# Patient Record
Sex: Female | Born: 2000 | Race: White | Hispanic: No | Marital: Single | Smoking: Never smoker
Health system: Southern US, Community
[De-identification: ages and names within clinical notes are randomized; demographics above are authoritative.]

## PROBLEM LIST (undated history)

## (undated) DIAGNOSIS — M419 Scoliosis, unspecified: Secondary | ICD-10-CM

## (undated) DIAGNOSIS — N83209 Unspecified ovarian cyst, unspecified side: Secondary | ICD-10-CM

## (undated) DIAGNOSIS — G43909 Migraine, unspecified, not intractable, without status migrainosus: Secondary | ICD-10-CM

## (undated) HISTORY — DX: Migraine, unspecified, not intractable, without status migrainosus: G43.909

## (undated) HISTORY — DX: Unspecified ovarian cyst, unspecified side: N83.209

---
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
## 2019-01-05 ENCOUNTER — Encounter: Payer: Self-pay | Admitting: Certified Nurse Midwife

## 2019-01-05 ENCOUNTER — Ambulatory Visit (INDEPENDENT_AMBULATORY_CARE_PROVIDER_SITE_OTHER): Payer: 59 | Admitting: Certified Nurse Midwife

## 2019-01-05 ENCOUNTER — Other Ambulatory Visit: Payer: Self-pay

## 2019-01-05 VITALS — BP 101/67 | HR 87 | Ht 67.0 in | Wt 116.0 lb

## 2019-01-05 DIAGNOSIS — G43109 Migraine with aura, not intractable, without status migrainosus: Secondary | ICD-10-CM

## 2019-01-05 DIAGNOSIS — Z975 Presence of (intrauterine) contraceptive device: Secondary | ICD-10-CM | POA: Diagnosis not present

## 2019-01-05 DIAGNOSIS — R102 Pelvic and perineal pain: Secondary | ICD-10-CM

## 2019-01-05 DIAGNOSIS — G43909 Migraine, unspecified, not intractable, without status migrainosus: Secondary | ICD-10-CM | POA: Insufficient documentation

## 2019-01-05 DIAGNOSIS — Z8742 Personal history of other diseases of the female genital tract: Secondary | ICD-10-CM | POA: Diagnosis not present

## 2019-01-05 NOTE — Progress Notes (Signed)
GYN ENCOUNTER NOTE  Subjective:       Christy Sanders is a 18 y.o. G0P0000 female is here for gynecologic evaluation of the following issues:  1. Intermittent pelvic pain for the last two (2) months-relief with Motrin and heating pad; not taking tramadol  Diagnosed with "right ovarian cyst" in ER on 12/01/2018.   Denies difficulty breathing or respiratory distress, chest pain, dysuria, dyspareunia, abnormal vaginal discharge, and leg pain or swelling.    Gynecologic History  Patient's last menstrual period was 12/05/2018 (exact date). Period Cycle (Days): 28 Period Duration (Days): 7 Period Pattern: Regular Menstrual Flow: Light Menstrual Control: Tampon Dysmenorrhea: (!) Moderate Dysmenorrhea Symptoms: Cramping  Contraception: IUD, Kyleena  Last Pap: N/A  Obstetric History  OB History  Gravida Para Term Preterm AB Living  0 0 0 0 0 0  SAB TAB Ectopic Multiple Live Births  0 0 0 0 0    Past Medical History:  Diagnosis Date  . Scoliosis     No past surgical history on file.  Current Outpatient Medications on File Prior to Visit  Medication Sig Dispense Refill  . ibuprofen (ADVIL) 400 MG tablet Take 400 mg by mouth every 6 (six) hours as needed.     No current facility-administered medications on file prior to visit.     No Known Allergies  Social History   Socioeconomic History  . Marital status: Single    Spouse name: Not on file  . Number of children: Not on file  . Years of education: Not on file  . Highest education level: Not on file  Occupational History  . Not on file  Social Needs  . Financial resource strain: Not on file  . Food insecurity    Worry: Not on file    Inability: Not on file  . Transportation needs    Medical: Not on file    Non-medical: Not on file  Tobacco Use  . Smoking status: Never Smoker  . Smokeless tobacco: Never Used  Substance and Sexual Activity  . Alcohol use: Not Currently  . Drug use: Never  . Sexual activity:  Yes    Birth control/protection: I.U.D., Condom  Lifestyle  . Physical activity    Days per week: Not on file    Minutes per session: Not on file  . Stress: Not on file  Relationships  . Social Musicianconnections    Talks on phone: Not on file    Gets together: Not on file    Attends religious service: Not on file    Active member of club or organization: Not on file    Attends meetings of clubs or organizations: Not on file    Relationship status: Not on file  . Intimate partner violence    Fear of current or ex partner: Not on file    Emotionally abused: Not on file    Physically abused: Not on file    Forced sexual activity: Not on file  Other Topics Concern  . Not on file  Social History Narrative  . Not on file    Family History  Problem Relation Age of Onset  . Ovarian cancer Maternal Grandmother   . Breast cancer Neg Hx   . Colon cancer Neg Hx     The following portions of the patient's history were reviewed and updated as appropriate: allergies, current medications, past family history, past medical history, past social history, past surgical history and problem list.  Review of Systems  ROS negative except  as noted above. Information obtained from patient.   Objective:   BP 101/67   Pulse 87   Ht 5\' 7"  (1.702 m)   Wt 116 lb (52.6 kg)   LMP 12/05/2018 (Exact Date)   BMI 18.17 kg/m    CONSTITUTIONAL: Well-developed, well-nourished female in no acute distress.   ABDOMEN: Soft, non distended; Right lower quadrant tenderness with palapation.  No Organomegaly.  MUSCULOSKELETAL: Normal range of motion. No tenderness.  No cyanosis, clubbing, or edema.  Recent Results (from the past 2160 hour(s))  Lipase, blood     Status: None   Collection Time: 11/30/18  3:06 PM  Result Value Ref Range   Lipase 38 11 - 51 U/L    Comment: Performed at [facility], [address] [place], [place]  Comprehensive metabolic panel     Status: Abnormal    Collection Time: 11/30/18  3:06 PM  Result Value Ref Range   Sodium 139 135 - 145 mmol/L   Potassium 3.7 3.5 - 5.1 mmol/L   Chloride 105 98 - 111 mmol/L   CO2 23 22 - 32 mmol/L   Glucose, Bld 115 (H) 70 - 99 mg/dL   BUN 19 6 - 20 mg/dL   Creatinine, Ser 01/30/19 0.44 - 1.00 mg/dL   Calcium 9.9 8.9 - 2.44 mg/dL   Total Protein 7.5 6.5 - 8.1 g/dL   Albumin 4.3 3.5 - 5.0 g/dL   AST 17 15 - 41 U/L   ALT 11 0 - 44 U/L   Alkaline Phosphatase 57 38 - 126 U/L   Total Bilirubin 0.5 0.3 - 1.2 mg/dL   GFR calc non Af Amer >60 >60 mL/min   GFR calc Af Amer >60 >60 mL/min   Anion gap 11 5 - 15    Comment: Performed at [facility], [address] [place], [place]  CBC     Status: None   Collection Time: 11/30/18  3:06 PM  Result Value Ref Range   WBC 5.7 4.0 - 10.5 K/uL   RBC 4.71 3.87 - 5.11 MIL/uL   Hemoglobin 13.8 12.0 - 15.0 g/dL   HCT 01/30/19 66.4 - 40.3 %   MCV 87.0 80.0 - 100.0 fL   MCH 29.3 26.0 - 34.0 pg   MCHC 33.7 30.0 - 36.0 g/dL   RDW 47.4 25.9 - 56.3 %   Platelets 216 150 - 400 K/uL   nRBC 0.0 0.0 - 0.2 %    Comment: Performed at [facility], [address] [place], [place]  Urinalysis, Complete w Microscopic     Status: Abnormal   Collection Time: 11/30/18  3:06 PM  Result Value Ref Range   Color, Urine STRAW (A) YELLOW   APPearance CLEAR (A) CLEAR   Specific Gravity, Urine 1.014 1.005 - 1.030   pH 5.0 5.0 - 8.0   Glucose, UA NEGATIVE NEGATIVE mg/dL   Hgb urine dipstick NEGATIVE NEGATIVE   Bilirubin Urine NEGATIVE NEGATIVE   Ketones, ur NEGATIVE NEGATIVE mg/dL   Protein, ur NEGATIVE NEGATIVE mg/dL   Nitrite NEGATIVE NEGATIVE   Leukocytes,Ua NEGATIVE NEGATIVE   RBC / HPF 0-5 0 - 5 RBC/hpf   WBC, UA 0-5 0 - 5 WBC/hpf   Bacteria, UA RARE (A) NONE SEEN   Squamous Epithelial / LPF 0-5 0 - 5   Mucus PRESENT     Comment: Performed at [facility], [address], [place], [place]  Pregnancy, urine POC  Status:  None   Collection Time: 11/30/18  3:14 PM  Result Value Ref Range   Preg Test, Ur NEGATIVE NEGATIVE    Comment:        THE SENSITIVITY OF THIS METHODOLOGY IS >24 mIU/mL   Chlamydia/NGC rt PCR ([facility] only)     Status: None   Collection Time: 12/01/18 11:07 PM   Specimen: Urine  Result Value Ref Range   Specimen source GC/Chlam URINE, RANDOM    Chlamydia Tr NOT DETECTED NOT DETECTED   N gonorrhoeae NOT DETECTED NOT DETECTED    Comment: (NOTE) This CT/NG assay has not been evaluated in patients with a history of  hysterectomy. Performed at [facility], [address] [place],  [address]     CLINICAL DATA:  Right lower quadrant pain.  Appendicitis suspected.  EXAM: CT ABDOMEN AND PELVIS WITH CONTRAST [facility] ER-12/01/2018 1800)  TECHNIQUE: Multidetector CT imaging of the abdomen and pelvis was performed using the standard protocol following bolus administration of intravenous contrast.  CONTRAST:  42mL OMNIPAQUE IOHEXOL 300 MG/ML  SOLN  COMPARISON:  Pelvic ultrasound of 1 day prior.  FINDINGS: Lower chest: Heart size accentuated by a pectus excavatum deformity. Clear lung bases. No pleural fluid.  Hepatobiliary: Normal liver. Normal gallbladder, without biliary ductal dilatation.  Pancreas: Normal, without mass or ductal dilatation.  Spleen: Normal in size, without focal abnormality.  Adrenals/Urinary Tract: Normal adrenal glands. Normal kidneys, without hydronephrosis. Normal urinary bladder.  Stomach/Bowel: Normal stomach, without wall thickening. Normal colon and terminal ileum. Portions of the appendix are felt to be identified, including on coronal image 24/5, possibly sagittal image 41. Normal small bowel. No pericecal inflammation is seen  Vascular/Lymphatic: Normal caliber of the aorta and branch vessels. No abdominopelvic adenopathy.  Reproductive: Intrauterine device. Right ovarian corpus luteal cyst, including at 1.7 cm on  64/2.  Other: Trace right pelvic fluid is likely physiologic and may relate to recent cyst rupture.  Musculoskeletal: Moderate convex left lumbar spine curvature.  IMPRESSION: 1. Right ovarian corpus luteal cyst. This likely accounts for the patient's right lower quadrant pain. Fluid in the pelvis may be physiologic or related to recent cyst rupture. 2. The appendix is difficult to visualize, but felt to be normal where visualized. No pericecal inflammation seen.   Assessment:   1. History of ovarian cyst  - US PELVIS TRANSVAGINAL NON-OB (TV ONLY); Future  2. Pelvic pain  - US PELVIS TRANSVAGINAL NON-OB (TV ONLY); Future  3. IUD (intrauterine device) in place  - US PELVIS TRANSVAGINAL NON-OB (TV ONLY); Future   Plan:   Education provided regarding ovarian cysts and management options, see AVS.   Sample of [place] given.   Reviewed red flag symptoms and when to call.   RTC x 2-3 weeks for ultrasound and results review or sooner if needed.    Diona Fanti, CNM [facility], [facility] 01/05/19 10:25 AM

## 2019-01-05 NOTE — Progress Notes (Signed)
Patient c/o intermittent pelvic pain x2 months, went to ER 9/3 and was dx with right ovarian cyst.  Discontinued Tramadol because "it didn't work", taking Advil and using heating pad with relief.

## 2019-01-05 NOTE — Patient Instructions (Addendum)
Ovarian Cyst     An ovarian cyst is a fluid-filled sac that forms on an ovary. The ovaries are small organs that produce eggs in women. Various types of cysts can form on the ovaries. Some may cause symptoms and require treatment. Most ovarian cysts go away on their own, are not cancerous (are benign), and do not cause problems. Common types of ovarian cysts include:  Functional (follicle) cysts. ? Occur during the menstrual cycle, and usually go away with the next menstrual cycle if you do not get pregnant. ? Usually cause no symptoms.  Endometriomas. ? Are cysts that form from the tissue that lines the uterus (endometrium). ? Are sometimes called "chocolate cysts" because they become filled with blood that turns brown. ? Can cause pain in the lower abdomen during intercourse and during your period.  Cystadenoma cysts. ? Develop from cells on the outside surface of the ovary. ? Can get very large and cause lower abdomen pain and pain with intercourse. ? Can cause severe pain if they twist or break open (rupture).  Dermoid cysts. ? Are sometimes found in both ovaries. ? May contain different kinds of body tissue, such as skin, teeth, hair, or cartilage. ? Usually do not cause symptoms unless they get very big.  Theca lutein cysts. ? Occur when too much of a certain hormone (human chorionic gonadotropin) is produced and overstimulates the ovaries to produce an egg. ? Are most common after having procedures used to assist with the conception of a baby (in vitro fertilization). What are the causes? Ovarian cysts may be caused by:  Ovarian hyperstimulation syndrome. This is a condition that can develop from taking fertility medicines. It causes multiple large ovarian cysts to form.  Polycystic ovarian syndrome (PCOS). This is a common hormonal disorder that can cause ovarian cysts, as well as problems with your period or fertility. What increases the risk? The following factors may  make you more likely to develop ovarian cysts:  Being overweight or obese.  Taking fertility medicines.  Taking certain forms of hormonal birth control.  Smoking. What are the signs or symptoms? Many ovarian cysts do not cause symptoms. If symptoms are present, they may include:  Pelvic pain or pressure.  Pain in the lower abdomen.  Pain during sex.  Abdominal swelling.  Abnormal menstrual periods.  Increasing pain with menstrual periods. How is this diagnosed? These cysts are commonly found during a routine pelvic exam. You may have tests to find out more about the cyst, such as:  Ultrasound.  X-ray of the pelvis.  CT scan.  MRI.  Blood tests. How is this treated? Many ovarian cysts go away on their own without treatment. Your health care provider may want to check your cyst regularly for 2-3 months to see if it changes. If you are in menopause, it is especially important to have your cyst monitored closely because menopausal women have a higher rate of ovarian cancer. When treatment is needed, it may include:  Medicines to help relieve pain.  A procedure to drain the cyst (aspiration).  Surgery to remove the whole cyst.  Hormone treatment or birth control pills. These methods are sometimes used to help dissolve a cyst. Follow these instructions at home:  Take over-the-counter and prescription medicines only as told by your health care provider.  Do not drive or use heavy machinery while taking prescription pain medicine.  Get regular pelvic exams and Pap tests as often as told by your health care provider.    Return to your normal activities as told by your health care provider. Ask your health care provider what activities are safe for you.  Do not use any products that contain nicotine or tobacco, such as cigarettes and e-cigarettes. If you need help quitting, ask your health care provider.  Keep all follow-up visits as told by your health care provider.  This is important. Contact a health care provider if:  Your periods are late, irregular, or painful, or they stop.  You have pelvic pain that does not go away.  You have pressure on your bladder or trouble emptying your bladder completely.  You have pain during sex.  You have any of the following in your abdomen: ? A feeling of fullness. ? Pressure. ? Discomfort. ? Pain that does not go away. ? Swelling.  You feel generally ill.  You become constipated.  You lose your appetite.  You develop severe acne.  You start to have more body hair and facial hair.  You are gaining weight or losing weight without changing your exercise and eating habits.  You think you may be pregnant. Get help right away if:  You have abdominal pain that is severe or gets worse.  You cannot eat or drink without vomiting.  You suddenly develop a fever.  Your menstrual period is much heavier than usual. This information is not intended to replace advice given to you by your health care provider. Make sure you discuss any questions you have with your health care provider. Document Released: 03/16/2005 Document Revised: 06/14/2017 Document Reviewed: 08/18/2015 Elsevier Patient Education  2020 [employer].  Drospirenone tablets (contraception) What is this medicine? DROSPIRENONE (dro SPY re nown) is an oral contraceptive (birth control pill). The product contains a female hormone known as a progestin. It is used to prevent pregnancy. This medicine may be used for other purposes; ask your health care provider or pharmacist if you have questions. COMMON BRAND NAME(S): FeRiva 21/7, SLYND What should I tell my health care provider before I take this medicine? They need to know if you have any of these conditions:  abnormal vaginal bleeding  adrenal gland disease  blood vessel disease or blood clots  breast, cervical, endometrial, ovarian, liver, or uterine cancer  diabetes  heart disease  or recent heart attack  high potassium level  kidney disease  liver disease  mental depression  migraine headaches  stroke  an unusual or allergic reaction to drospirenone, progestins, or other medicines, foods, dyes, or preservatives  pregnant or trying to get pregnant  breast-feeding How should I use this medicine? Take this medicine by mouth. To reduce nausea, this medicine may be taken with food. Follow the directions on the prescription label. Take this medicine at the same time each day and in the order directed on the package. Do not take your medicine more often than directed. A patient package insert for the product will be given with each prescription and refill. Read this sheet carefully each time. The sheet may change frequently. Talk to your pediatrician regarding the use of this medicine in children. Special care may be needed. This medicine has been used in female children who have started having menstrual periods. Overdosage: If you think you have taken too much of this medicine contact a poison control center or emergency room at once. NOTE: This medicine is only for you. Do not share this medicine with others. What if I miss a dose? If you miss a dose, take it as soon as  you can and refer to the patient information sheet you received with your medicine for direction. If you miss more than one pill, this medicine may not be as effective and you may need to use another form of birth control. What may interact with this medicine? Do not take this medicine with any of the following medications:  atazanavir; cobicistat  bosentan  fosamprenavir This medicine may also interact with the following medications:  aprepitant  barbiturates like phenobarbital, primidone  carbamazepine  certain antibiotics like clarithromycin, rifampin, rifabutin, rifapentine  certain antivirals for HIV or hepatitis  certain diuretics like amiloride, spironolactone, triamterene   certain medicines for fungal infections like griseofulvin, ketoconazole, itraconazole, voriconazole  certain medicines for blood pressure, heart disease  cyclosporine  felbamate  heparin  medicines for diabetes  modafinil  NSAIDs, medicines for pain and inflammation, like ibuprofen or naproxen  oxcarbazepine  phenytoin  potassium supplements  rufinamide  St. John's wort  topiramate This list may not describe all possible interactions. Give your health care provider a list of all the medicines, herbs, non-prescription drugs, or dietary supplements you use. Also tell them if you smoke, drink alcohol, or use illegal drugs. Some items may interact with your medicine. What should I watch for while using this medicine? Visit your doctor or health care professional for regular checks on your progress. You will need a regular breast and pelvic exam and Pap smear while on this medicine. You may need blood work done while you are taking this medicine. If you have any reason to think you are pregnant, stop taking this medicine right away and contact your doctor or health care professional. This medicine does not protect you against HIV infection (AIDS) or any other sexually transmitted diseases. If you are going to have elective surgery, you may need to stop taking this medicine before the surgery. Consult your health care professional for advice. What side effects may I notice from receiving this medicine? Side effects that you should report to your doctor or health care professional as soon as possible:  allergic reactions like skin rash, itching or hives, swelling of the face, lips, or tongue  breast tissue changes or discharge  depressed mood  severe pain, swelling, or tenderness in the abdomen  signs and symptoms of a blood clot such as chest pain; shortness of breath; pain, swelling, or warmth in the leg  signs and symptoms of increased potassium like muscle weakness; chest  pain; or fast, irregular heartbeat  signs and symptoms of liver injury like dark yellow or brown urine; general ill feeling or flu-like symptoms; light-colored stools; loss of appetite; nausea; right upper belly pain; unusually weak or tired; yellowing of the eyes or skin  signs and symptoms of a stroke like changes in vision; confusion; trouble speaking or understanding; severe headaches; sudden numbness or weakness of the face, arm or leg; trouble walking; dizziness; loss of balance or coordination  unusual vaginal bleeding  unusually weak or tired Side effects that usually do not require medical attention (report these to your doctor or health care professional if they continue or are bothersome):  acne  breast tenderness  headache  menstrual cramps  nausea  weight gain This list may not describe all possible side effects. Call your doctor for medical advice about side effects. You may report side effects to FDA at 1-800-FDA-1088. Where should I keep my medicine? Keep out of the reach of children. Store at room temperature between 20 and 25 degrees C (68 and  77 degrees F). Throw away any unused medicine after the expiration date. NOTE: This sheet is a summary. It may not cover all possible information. If you have questions about this medicine, talk to your doctor, pharmacist, or health care provider.  2020 Elsevier/Gold Standard (2017-08-25 15:01:56)

## 2019-01-24 ENCOUNTER — Other Ambulatory Visit (HOSPITAL_COMMUNITY)
Admission: RE | Admit: 2019-01-24 | Discharge: 2019-01-24 | Disposition: A | Discharge status: Home / Self Care | Payer: 59 | Source: Ambulatory Visit | Attending: Certified Nurse Midwife | Admitting: Certified Nurse Midwife

## 2019-01-24 ENCOUNTER — Ambulatory Visit (INDEPENDENT_AMBULATORY_CARE_PROVIDER_SITE_OTHER): Payer: 59 | Admitting: Certified Nurse Midwife

## 2019-01-24 ENCOUNTER — Encounter: Payer: Self-pay | Admitting: Certified Nurse Midwife

## 2019-01-24 ENCOUNTER — Ambulatory Visit (INDEPENDENT_AMBULATORY_CARE_PROVIDER_SITE_OTHER): Payer: 59

## 2019-01-24 ENCOUNTER — Other Ambulatory Visit: Payer: Self-pay

## 2019-01-24 VITALS — BP 105/69 | HR 79 | Ht 67.0 in | Wt 114.4 lb

## 2019-01-24 DIAGNOSIS — R102 Pelvic and perineal pain: Secondary | ICD-10-CM

## 2019-01-24 DIAGNOSIS — Z975 Presence of (intrauterine) contraceptive device: Secondary | ICD-10-CM

## 2019-01-24 DIAGNOSIS — Z8742 Personal history of other diseases of the female genital tract: Secondary | ICD-10-CM

## 2019-01-24 NOTE — Progress Notes (Signed)
GYN ENCOUNTER NOTE  Subjective:       Christy Sanders is a 18 y.o. G0P0000 female here for ultrasound results review.   Still having intermittent right pelvic pain; sporadic in nature, lasting for short periods of time; relieved by heating pad and use of Motrin.   Last seen in office for evaluation of pelvic pain and history of ovarian cyst on 01/05/2019.   Denies difficulty breathing or respiratory distress, chest pain, dysuria, and leg pain or swelling.    Gynecologic History  Patient's last menstrual period was 01/13/2019 (exact date). Period Cycle (Days): 28 Period Duration (Days): 7 Period Pattern: Regular Menstrual Flow: Light Menstrual Control: Tampon, Panty liner Dysmenorrhea: (!) Moderate Dysmenorrhea Symptoms: Cramping  Contraception: IUD, Kyleena  Last Pap: N/A  Obstetric History  OB History  Gravida Para Term Preterm AB Living  0 0 0 0 0 0  SAB TAB Ectopic Multiple Live Births  0 0 0 0 0    Past Medical History:  Diagnosis Date  . Migraines   . Ovarian cyst   . Scoliosis     No past surgical history on file.  Current Outpatient Medications on File Prior to Visit  Medication Sig Dispense Refill  . Drospirenone (SLYND) 4 MG TABS Take 1 tablet by mouth daily.    Marland Kitchen ibuprofen (ADVIL) 400 MG tablet Take 400 mg by mouth every 6 (six) hours as needed.    Marland Kitchen KYLEENA 19.5 MG IUD 19.5 mg by Intrauterine route once.    . SUMAtriptan (IMITREX) 25 MG tablet Take 25 mg by mouth every 2 (two) hours as needed for migraine. May repeat in 2 hours if headache persists or recurs.     No current facility-administered medications on file prior to visit.     No Known Allergies  Social History   Socioeconomic History  . Marital status: Single    Spouse name: Not on file  . Number of children: Not on file  . Years of education: Not on file  . Highest education level: Not on file  Occupational History  . Not on file  Social Needs  . Financial resource strain: Not on  file  . Food insecurity    Worry: Not on file    Inability: Not on file  . Transportation needs    Medical: Not on file    Non-medical: Not on file  Tobacco Use  . Smoking status: Never Smoker  . Smokeless tobacco: Never Used  Substance and Sexual Activity  . Alcohol use: Not Currently  . Drug use: Never  . Sexual activity: Not Currently    Birth control/protection: I.U.D., Condom, Pill  Lifestyle  . Physical activity    Days per week: Not on file    Minutes per session: Not on file  . Stress: Not on file  Relationships  . Social Musician on phone: Not on file    Gets together: Not on file    Attends religious service: Not on file    Active member of club or organization: Not on file    Attends meetings of clubs or organizations: Not on file    Relationship status: Not on file  . Intimate partner violence    Fear of current or ex partner: Not on file    Emotionally abused: Not on file    Physically abused: Not on file    Forced sexual activity: Not on file  Other Topics Concern  . Not on file  Social History  Narrative  . Not on file    Family History  Problem Relation Age of Onset  . Ovarian cancer Maternal Grandmother   . Breast cancer Neg Hx   . Colon cancer Neg Hx     The following portions of the patient's history were reviewed and updated as appropriate: allergies, current medications, past family history, past medical history, past social history, past surgical history and problem list.  Review of Systems  ROS negative except as noted above. Information obtained from patient.   Objective:   BP 105/69   Pulse 79   Ht 5\' 7"  (1.702 m)   Wt 114 lb 6.4 oz (51.9 kg)   LMP 01/13/2019 (Exact Date)   BMI 17.92 kg/m   CONSTITUTIONAL: Well-developed, well-nourished female in no acute distress.   PHYSICAL EXAM: Not indicated.   ULTRASOUND REPORT  Location: [facility]  Date of Service: 01/24/2019     Indications:Pelvic  Pain Findings:  The uterus is anteverted and measures 5.8 x 3.1 x 4.6 cm.. Echo texture is homogenous without evidence of focal masses.  The Endometrium measures 3 mm.IUD appropriately position within the endometrial cavity.  Right Ovary measures 2.9 x 3.1 x 1.9 cm. It is normal in appearance. Left Ovary measures 2.4 x 1.8 x 2.3 cm. It is normal in appearance. Survey of the adnexa demonstrates no adnexal masses. There is no free fluid in the cul de sac.  Impression: 1. IUD appropriately position within the endometrial cavity.  Recommendations: 1.Clinical correlation with the patient's History and Physical Exam.  Assessment:   1. IUD (intrauterine device) in place  - Cervicovaginal ancillary only  2. History of ovarian cyst   3. Pelvic pain  - Cervicovaginal ancillary only   Plan:   Ultrasound findings reviewed with patient, verbalized understanding.   Vaginal swab self collected to check for yeast and BV.   Discussed symptom management options including home treatment measures, physical therapy and IUD removal; patient will decided after vaginal swab results are available.   Reviewed red flag symptoms and when to call.   RTC if symptoms worsen or fail to improve.    Diona Fanti, CNM [facility], [facility] 01/24/19 5:31 PM

## 2019-01-24 NOTE — Progress Notes (Signed)
Patient here to discuss today's ultrasound.  Patient c/o still having right pelvic pain intermittently, takes Advil and uses heating pad with relief.

## 2019-01-24 NOTE — Patient Instructions (Signed)
Levonorgestrel intrauterine device (IUD) What is this medicine? LEVONORGESTREL IUD (LEE voe nor jes trel) is a contraceptive (birth control) device. The device is placed inside the uterus by a healthcare professional. It is used to prevent pregnancy. This device can also be used to treat heavy bleeding that occurs during your period. This medicine may be used for other purposes; ask your health care provider or pharmacist if you have questions. COMMON BRAND NAME(S): Kyleena, LILETTA, Mirena, Skyla What should I tell my health care provider before I take this medicine? They need to know if you have any of these conditions:  abnormal Pap smear  cancer of the breast, uterus, or cervix  diabetes  endometritis  genital or pelvic infection now or in the past  have more than one sexual partner or your partner has more than one partner  heart disease  history of an ectopic or tubal pregnancy  immune system problems  IUD in place  liver disease or tumor  problems with blood clots or take blood-thinners  seizures  use intravenous drugs  uterus of unusual shape  vaginal bleeding that has not been explained  an unusual or allergic reaction to levonorgestrel, other hormones, silicone, or polyethylene, medicines, foods, dyes, or preservatives  pregnant or trying to get pregnant  breast-feeding How should I use this medicine? This device is placed inside the uterus by a health care professional. Talk to your pediatrician regarding the use of this medicine in children. Special care may be needed. Overdosage: If you think you have taken too much of this medicine contact a poison control center or emergency room at once. NOTE: This medicine is only for you. Do not share this medicine with others. What if I miss a dose? This does not apply. Depending on the brand of device you have inserted, the device will need to be replaced every 3 to 6 years if you wish to continue using this type  of birth control. What may interact with this medicine? Do not take this medicine with any of the following medications:  amprenavir  bosentan  fosamprenavir This medicine may also interact with the following medications:  aprepitant  armodafinil  barbiturate medicines for inducing sleep or treating seizures  bexarotene  boceprevir  griseofulvin  medicines to treat seizures like carbamazepine, ethotoin, felbamate, oxcarbazepine, phenytoin, topiramate  modafinil  pioglitazone  rifabutin  rifampin  rifapentine  some medicines to treat HIV infection like atazanavir, efavirenz, indinavir, lopinavir, nelfinavir, tipranavir, ritonavir  St. John's wort  warfarin This list may not describe all possible interactions. Give your health care provider a list of all the medicines, herbs, non-prescription drugs, or dietary supplements you use. Also tell them if you smoke, drink alcohol, or use illegal drugs. Some items may interact with your medicine. What should I watch for while using this medicine? Visit your doctor or health care professional for regular check ups. See your doctor if you or your partner has sexual contact with others, becomes HIV positive, or gets a sexual transmitted disease. This product does not protect you against HIV infection (AIDS) or other sexually transmitted diseases. You can check the placement of the IUD yourself by reaching up to the top of your vagina with clean fingers to feel the threads. Do not pull on the threads. It is a good habit to check placement after each menstrual period. Call your doctor right away if you feel more of the IUD than just the threads or if you cannot feel the threads at   all. The IUD may come out by itself. You may become pregnant if the device comes out. If you notice that the IUD has come out use a backup birth control method like condoms and call your health care provider. Using tampons will not change the position of the  IUD and are okay to use during your period. This IUD can be safely scanned with magnetic resonance imaging (MRI) only under specific conditions. Before you have an MRI, tell your healthcare provider that you have an IUD in place, and which type of IUD you have in place. What side effects may I notice from receiving this medicine? Side effects that you should report to your doctor or health care professional as soon as possible:  allergic reactions like skin rash, itching or hives, swelling of the face, lips, or tongue  fever, flu-like symptoms  genital sores  high blood pressure  no menstrual period for 6 weeks during use  pain, swelling, warmth in the leg  pelvic pain or tenderness  severe or sudden headache  signs of pregnancy  stomach cramping  sudden shortness of breath  trouble with balance, talking, or walking  unusual vaginal bleeding, discharge  yellowing of the eyes or skin Side effects that usually do not require medical attention (report to your doctor or health care professional if they continue or are bothersome):  acne  breast pain  change in sex drive or performance  changes in weight  cramping, dizziness, or faintness while the device is being inserted  headache  irregular menstrual bleeding within first 3 to 6 months of use  nausea This list may not describe all possible side effects. Call your doctor for medical advice about side effects. You may report side effects to FDA at 1-800-FDA-1088. Where should I keep my medicine? This does not apply. NOTE: This sheet is a summary. It may not cover all possible information. If you have questions about this medicine, talk to your doctor, pharmacist, or health care provider.  2020 Elsevier/Gold Standard (2018-01-25 13:22:01) Pelvic Pain, Female Pelvic pain is pain in your lower abdomen, below your belly button and between your hips. The pain may start suddenly (be acute), keep coming back (be  recurring), or last a long time (become chronic). Pelvic pain that lasts longer than 6 months is considered chronic. Pelvic pain may affect your:  Reproductive organs.  Urinary system.  Digestive tract.  Musculoskeletal system. There are many potential causes of pelvic pain. Sometimes, the pain can be a result of digestive or urinary conditions, strained muscles or ligaments, or reproductive conditions. Sometimes the cause of pelvic pain is not known. Follow these instructions at home:   Take over-the-counter and prescription medicines only as told by your health care provider.  Rest as told by your health care provider.  Do not have sex if it hurts.  Keep a journal of your pelvic pain. Write down: ? When the pain started. ? Where the pain is located. ? What seems to make the pain better or worse, such as food or your period (menstrual cycle). ? Any symptoms you have along with the pain.  Keep all follow-up visits as told by your health care provider. This is important. Contact a health care provider if:  Medicine does not help your pain.  Your pain comes back.  You have new symptoms.  You have abnormal vaginal discharge or bleeding, including bleeding after menopause.  You have a fever or chills.  You are constipated.  You have  blood in your urine or stool.  You have foul-smelling urine.  You feel weak or light-headed. Get help right away if:  You have sudden severe pain.  Your pain gets steadily worse.  You have severe pain along with fever, nausea, vomiting, or excessive sweating.  You lose consciousness. Summary  Pelvic pain is pain in your lower abdomen, below your belly button and between your hips.  There are many potential causes of pelvic pain.  Keep a journal of your pelvic pain. This information is not intended to replace advice given to you by your health care provider. Make sure you discuss any questions you have with your health care  provider. Document Released: 02/11/2004 Document Revised: 09/01/2017 Document Reviewed: 09/01/2017 Elsevier Patient Education  2020 [employer].

## 2019-01-26 LAB — CERVICOVAGINAL ANCILLARY ONLY
Bacterial Vaginitis (gardnerella): NEGATIVE
Candida Glabrata: NEGATIVE
Candida Vaginitis: NEGATIVE
Comment: NEGATIVE
Comment: NEGATIVE
Comment: NEGATIVE
Comment: NEGATIVE
Trichomonas: NEGATIVE

## 2020-06-04 IMAGING — US US PELVIS COMPLETE
1 series · 14 of 25 positions shown · non-contrast
Comparison: None.

CLINICAL DATA: Initial evaluation for acute worsening right lower
quadrant pain.

EXAM:
TRANSABDOMINAL ULTRASOUND OF PELVIS
DOPPLER ULTRASOUND OF OVARIES
TECHNIQUE: Transabdominal ultrasound examination of the pelvis was performed
including evaluation of the uterus, ovaries, adnexal regions, and
pelvic cul-de-sac.
Color and duplex Doppler ultrasound was utilized to evaluate blood
flow to the ovaries.

[Series 1: us pelvis complete · 14 of 125 slices shown]
[im 1/125]
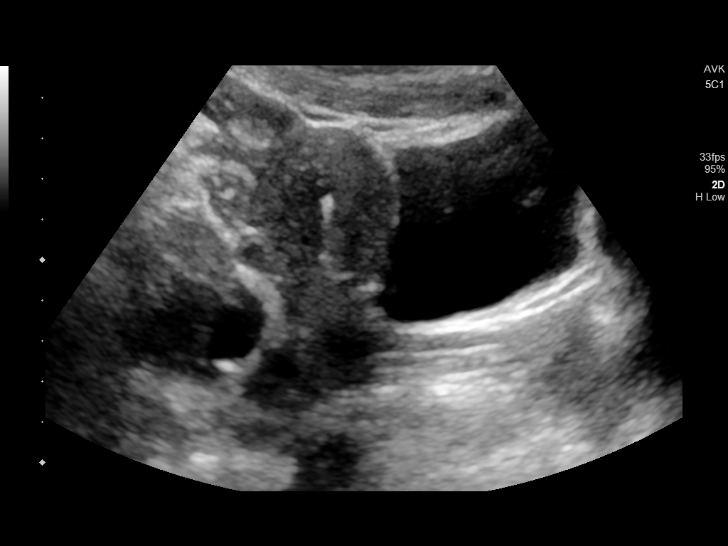
[im 11/125]
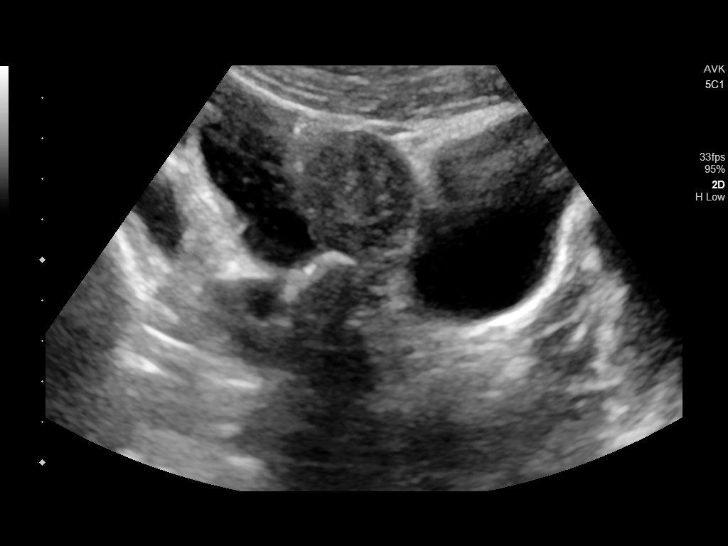
[im 21/125]
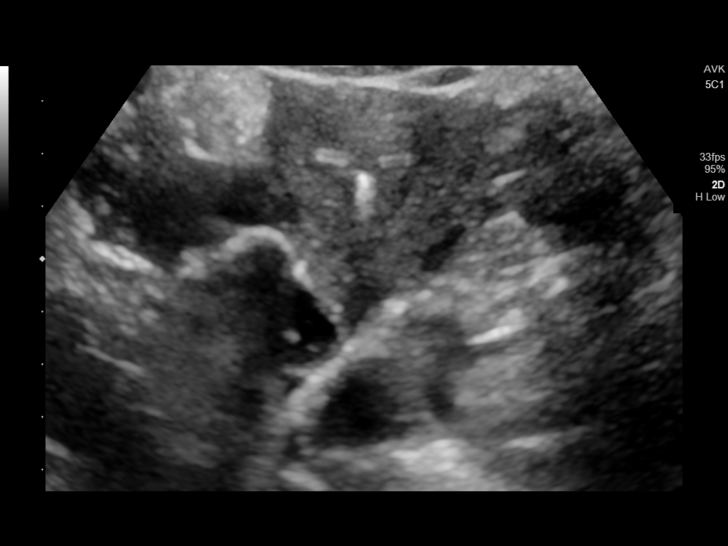
[im 32/125]
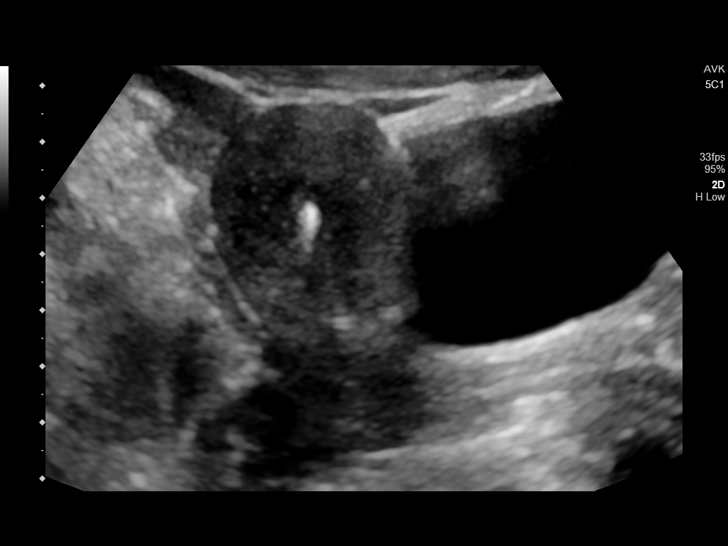
[im 42/125]
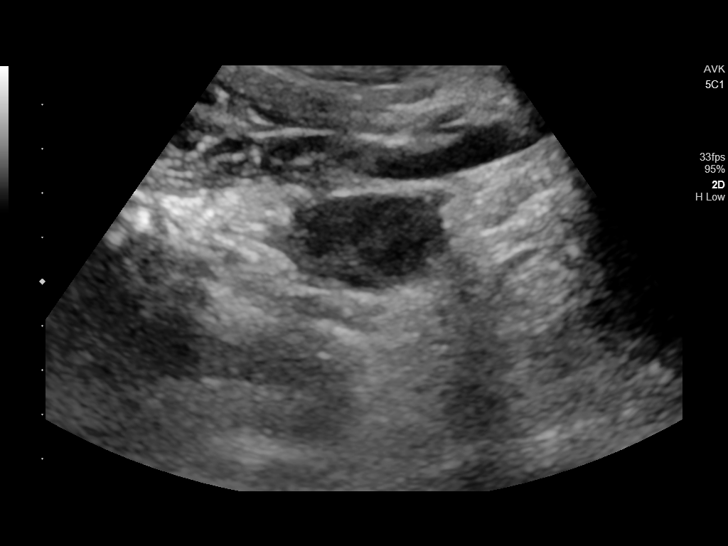
[im 47/125]
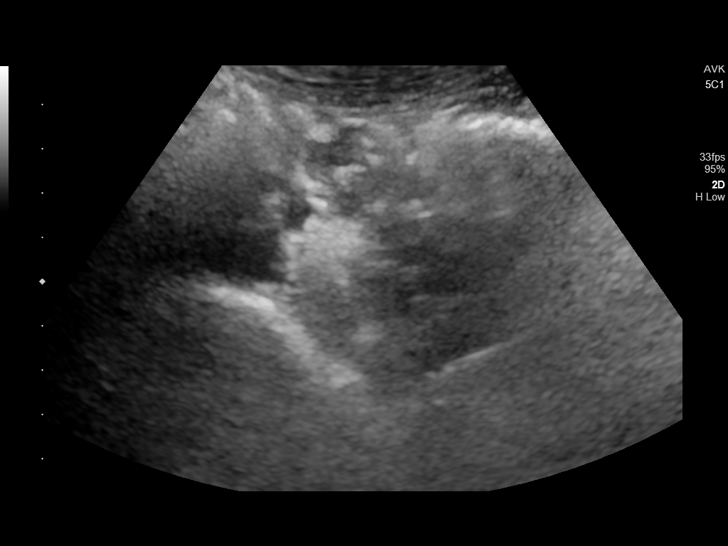
[im 57/125]
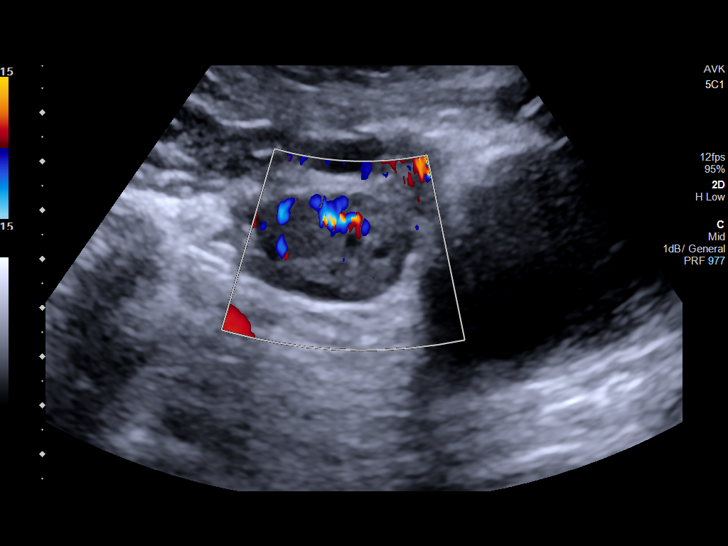
[im 68/125]
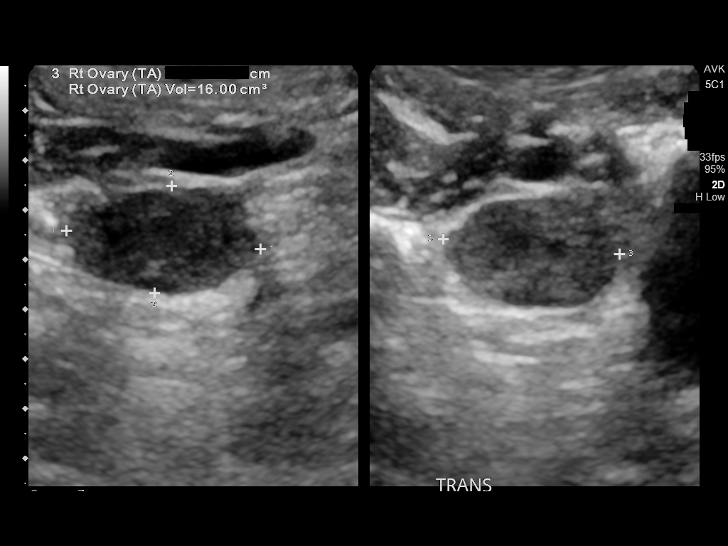
[im 78/125]
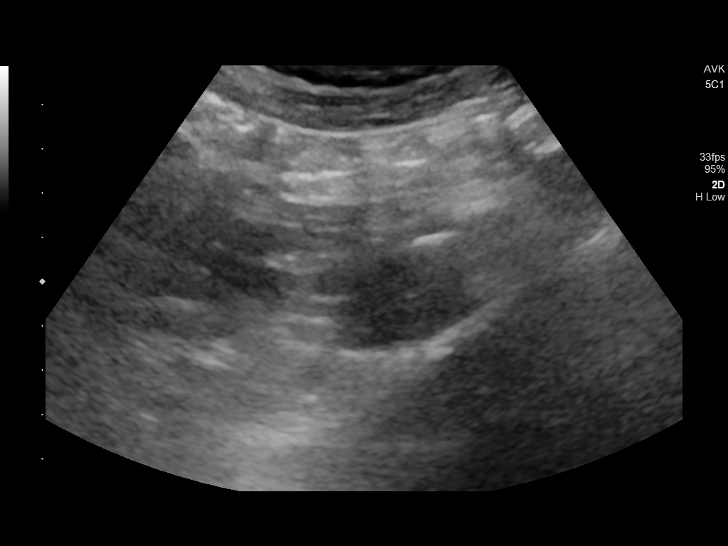
[im 83/125]
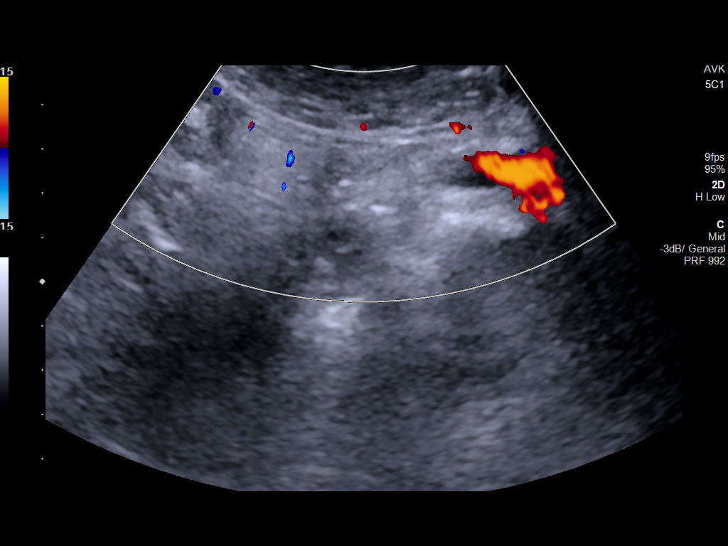
[im 94/125]
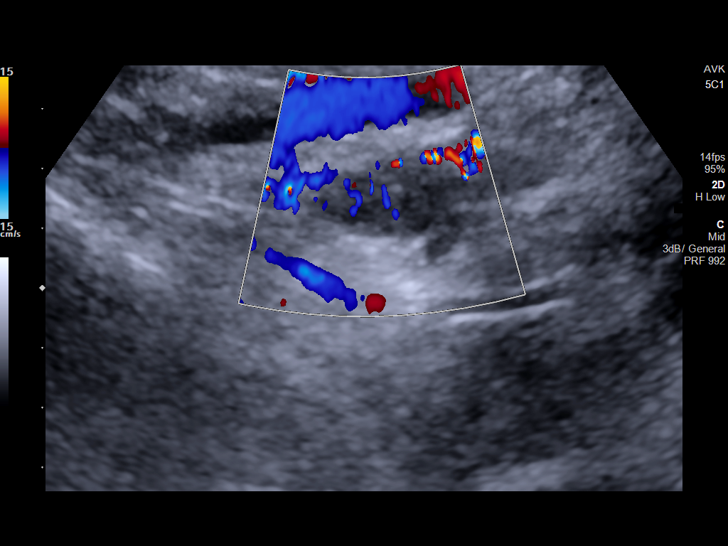
[im 104/125]
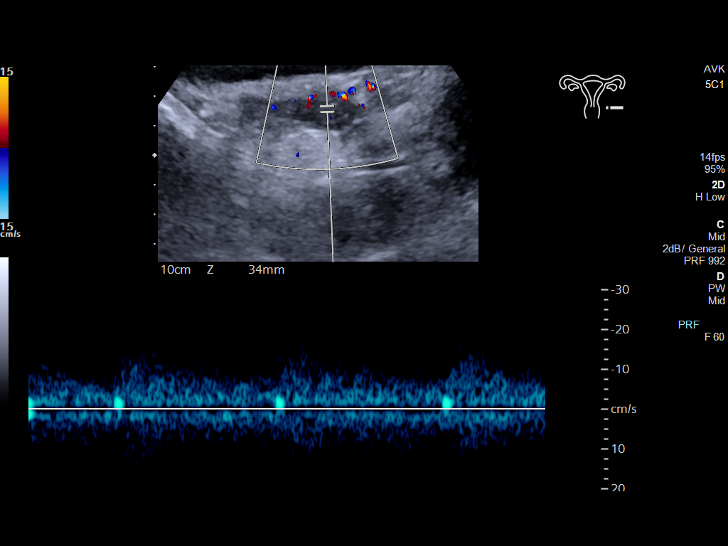
[im 114/125]
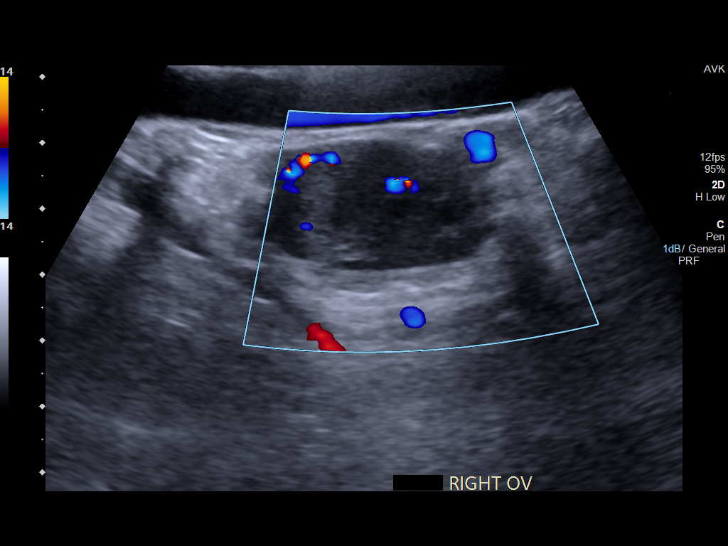
[im 125/125]
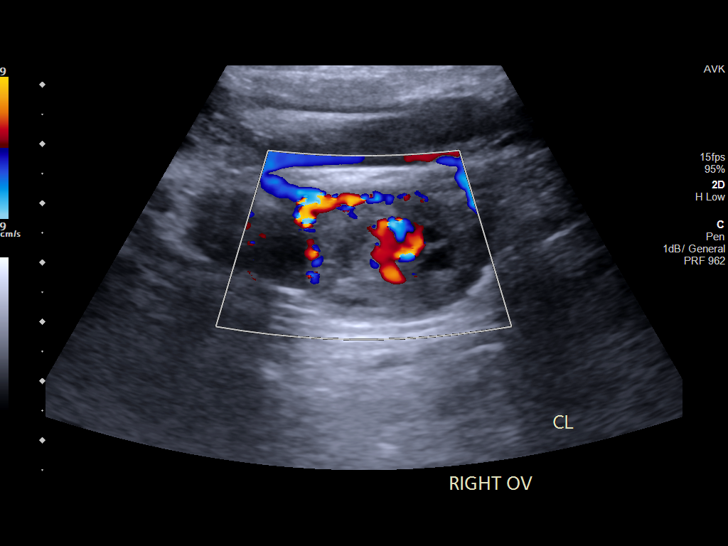

[14 of 25 positions shown; findings below may reference images not displayed]

FINDINGS: Uterus

Measurements: 6.9 x 3.6 x 4.1 cm = volume: 52.4 mL. No fibroids or
other mass visualized.

Endometrium

Thickness: 3.4 mm. No focal abnormality visualized. IUD well
position within the endometrial cavity.

Right ovary

Measurements: 3.5 x 2.6 x 2.0 cm = volume: 9.6 mL. Normal
appearance/no adnexal mass.

Left ovary

Measurements: 3.6 x 1.5 x 1.9 cm = volume: 5.2 mL. Normal
appearance/no adnexal mass.

Pulsed Doppler evaluation demonstrates normal low-resistance
arterial and venous waveforms in both ovaries.

Other: No free fluid seen within the pelvis.
IMPRESSION: 1. Normal pelvic ultrasound. No evidence for ovarian torsion or
other abnormality.
2. IUD appropriately position within the endometrial cavity.

## 2020-06-04 IMAGING — CT CT ABD-PELV W/ CM
2 of 4 series · 16 of 46 positions shown, 18 images · IV contrast (APPLIED)
Comparison: Pelvic ultrasound of 1 day prior.

CLINICAL DATA: Right lower quadrant pain.  Appendicitis suspected.

EXAM:
CT ABDOMEN AND PELVIS WITH CONTRAST
TECHNIQUE: Multidetector CT imaging of the abdomen and pelvis was performed
using the standard protocol following bolus administration of
intravenous contrast.
CONTRAST:  75mL OMNIPAQUE IOHEXOL 300 MG/ML  SOLN

[Series 2: routine abd/pel with · axial · 0.62mm/px · z∈[-460,-55]mm · 13 of 89 slices shown, 15 images]
[im 4/89  soft-tissue]
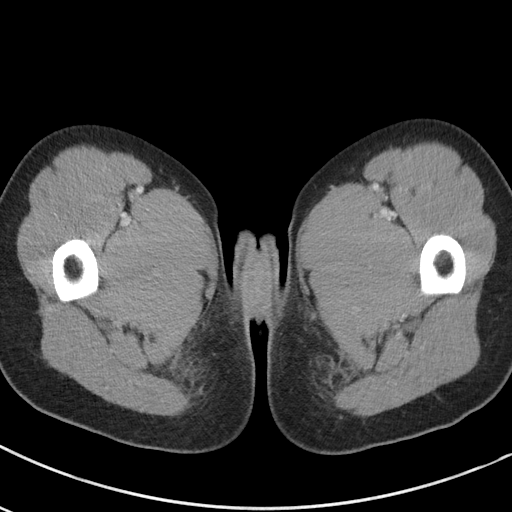
[im 4/89  bone]
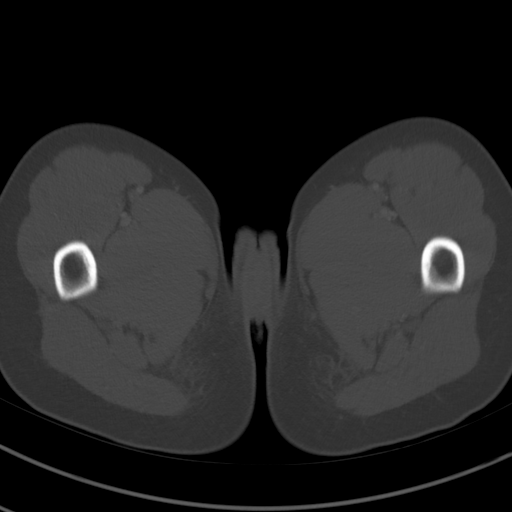
[im 11/89  soft-tissue]
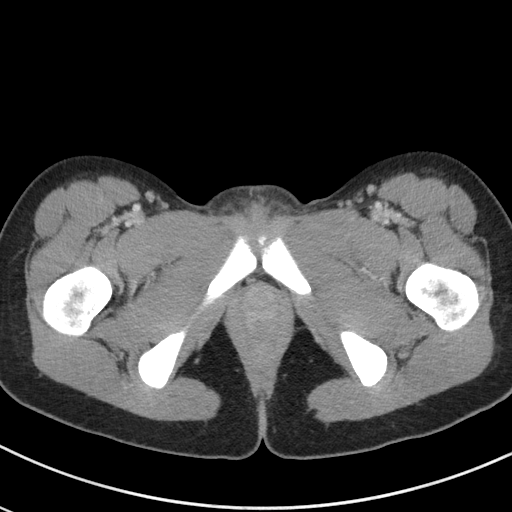
[im 17/89  soft-tissue]
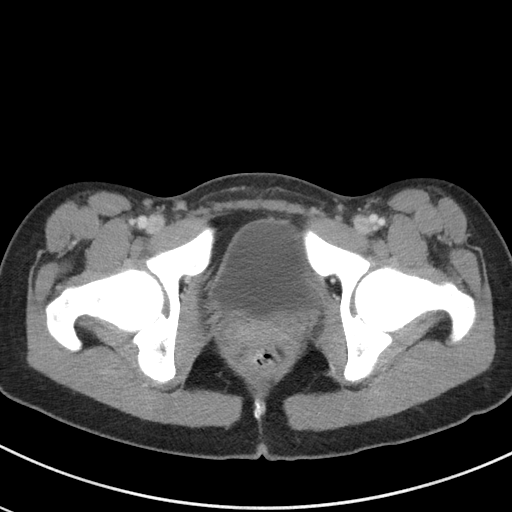
[im 24/89  soft-tissue]
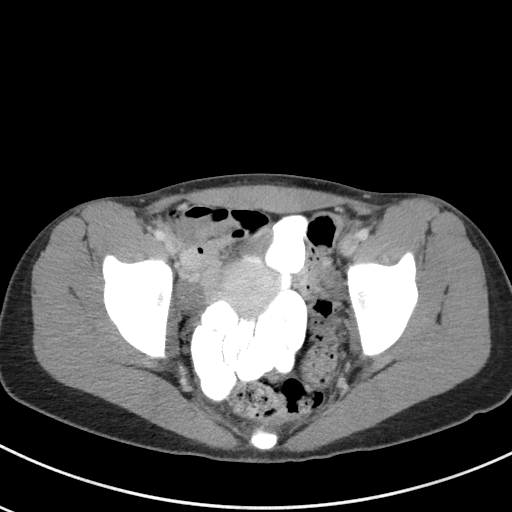
[im 31/89  soft-tissue]
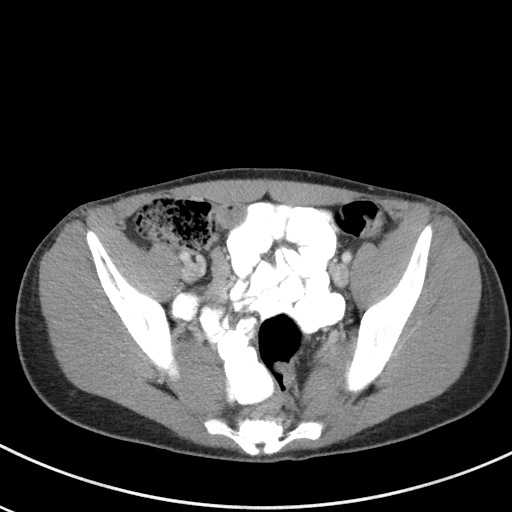
[im 38/89  soft-tissue]
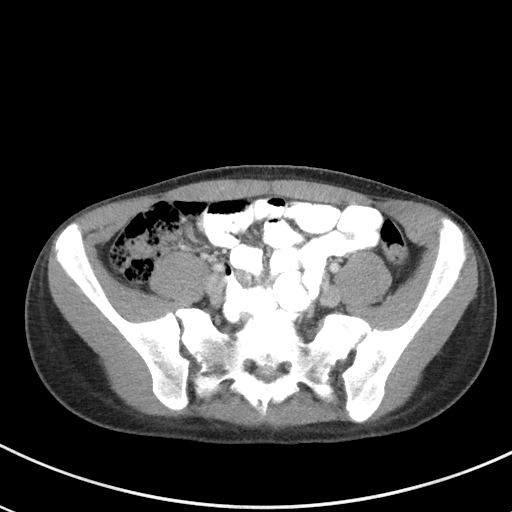
[im 45/89  soft-tissue]
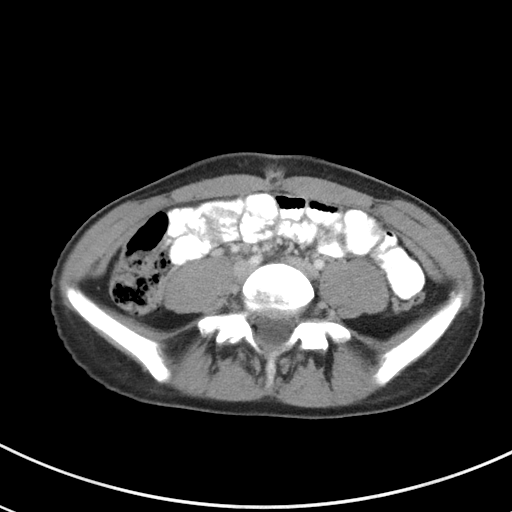
[im 51/89  soft-tissue]
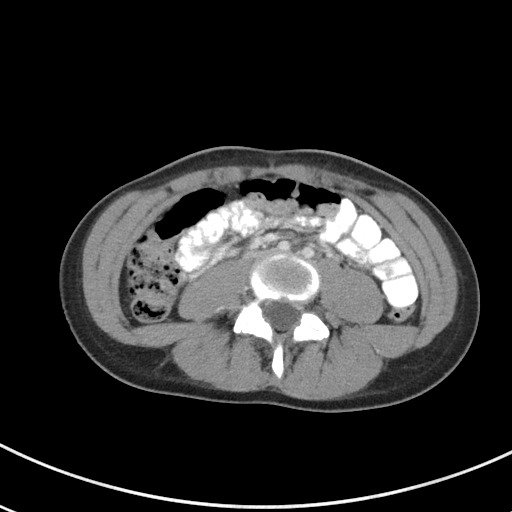
[im 58/89  soft-tissue]
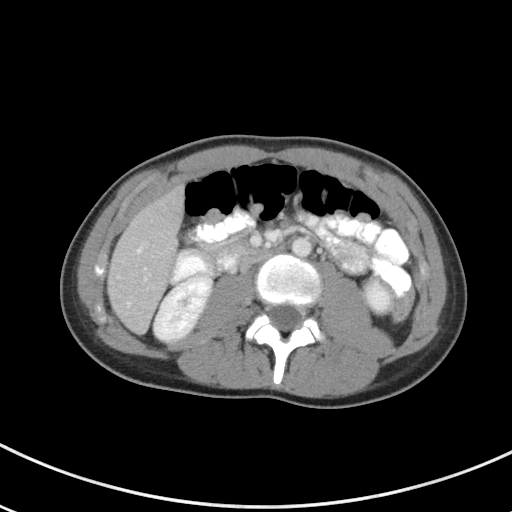
[im 58/89  bone]
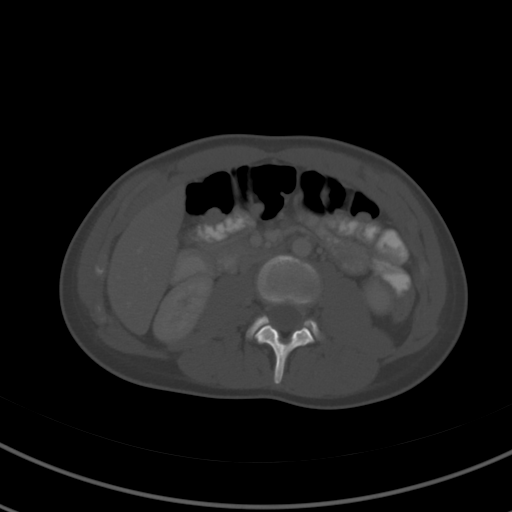
[im 65/89  soft-tissue]
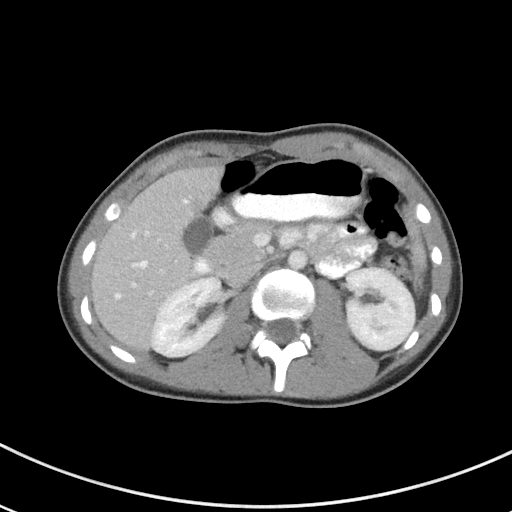
[im 72/89  soft-tissue]
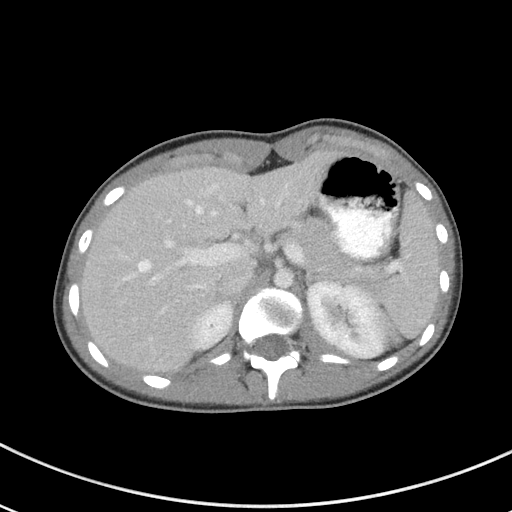
[im 78/89  soft-tissue]
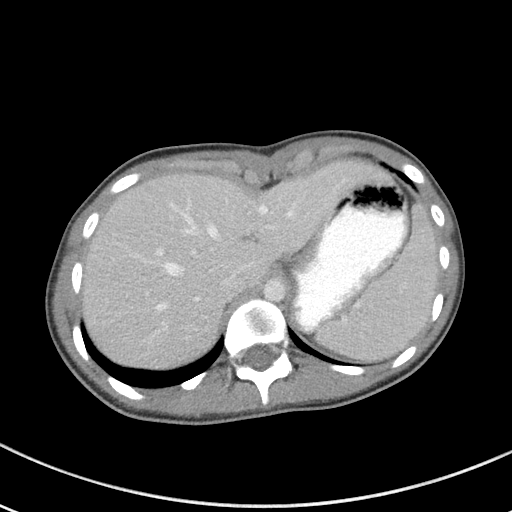
[im 85/89  soft-tissue]
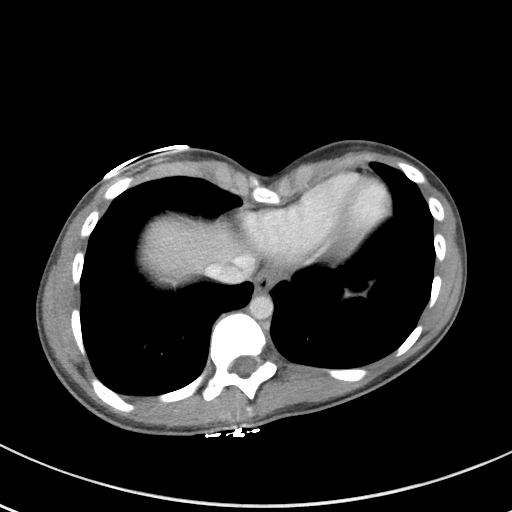

[Series 5: coronal st · coronal · 0.72mm/px · 3 of 74 slices shown]
[im 25/74  soft-tissue]
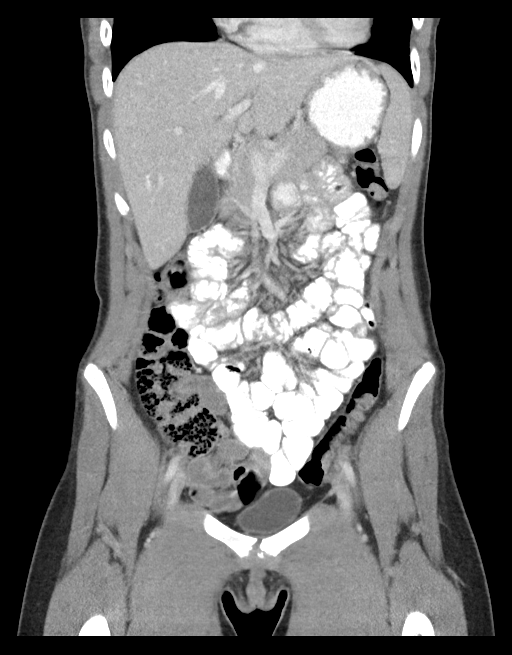
[im 33/74  soft-tissue]
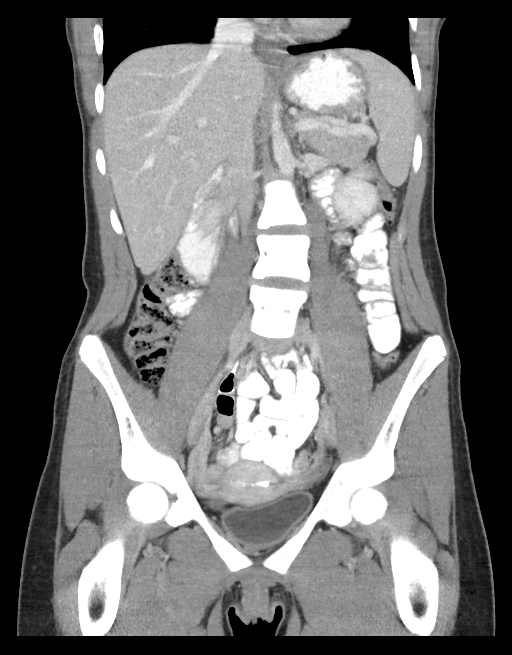
[im 41/74  soft-tissue]
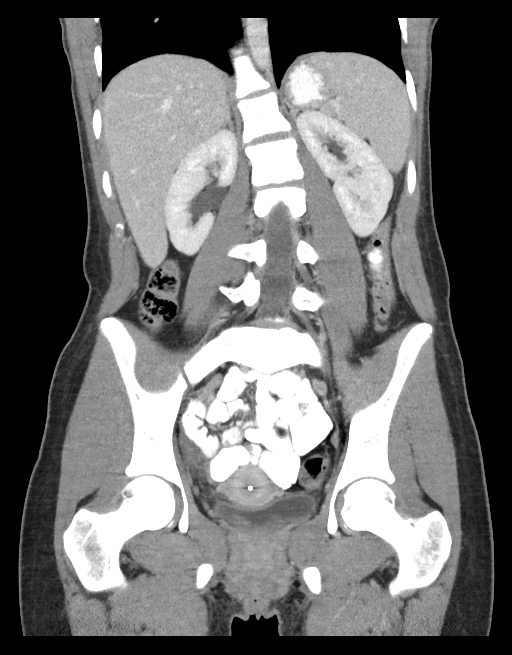

[16 of 46 positions shown; findings below may reference images not displayed]

FINDINGS: Lower chest: Heart size accentuated by a pectus excavatum deformity.
Clear lung bases. No pleural fluid.

Hepatobiliary: Normal liver. Normal gallbladder, without biliary
ductal dilatation.

Pancreas: Normal, without mass or ductal dilatation.

Spleen: Normal in size, without focal abnormality.

Adrenals/Urinary Tract: Normal adrenal glands. Normal kidneys,
without hydronephrosis. Normal urinary bladder.

Stomach/Bowel: Normal stomach, without wall thickening. Normal colon
and terminal ileum. Portions of the appendix are felt to be
identified, including on coronal image [DATE], possibly sagittal image
41. Normal small bowel. No pericecal inflammation is seen

Vascular/Lymphatic: Normal caliber of the aorta and branch vessels.
No abdominopelvic adenopathy.

Reproductive: Intrauterine device. Right ovarian corpus luteal cyst,
including at 1.7 cm on 64/2.

Other: Trace right pelvic fluid is likely physiologic and may relate
to recent cyst rupture.

Musculoskeletal: Moderate convex left lumbar spine curvature.
IMPRESSION: 1. Right ovarian corpus luteal cyst. This likely accounts for the
patient's right lower quadrant pain. Fluid in the pelvis may be
physiologic or related to recent cyst rupture.
2. The appendix is difficult to visualize, but felt to be normal
where visualized. No pericecal inflammation seen.

## 2022-01-21 ENCOUNTER — Encounter: Payer: Self-pay | Admitting: Medical

## 2022-01-21 ENCOUNTER — Other Ambulatory Visit: Payer: Self-pay

## 2022-01-21 ENCOUNTER — Ambulatory Visit (INDEPENDENT_AMBULATORY_CARE_PROVIDER_SITE_OTHER): Payer: 59 | Admitting: Medical

## 2022-01-21 VITALS — BP 118/83 | HR 86 | Temp 99.0°F | Ht 67.32 in | Wt 124.0 lb

## 2022-01-21 DIAGNOSIS — F419 Anxiety disorder, unspecified: Secondary | ICD-10-CM

## 2022-01-21 DIAGNOSIS — G479 Sleep disorder, unspecified: Secondary | ICD-10-CM | POA: Diagnosis not present

## 2022-01-21 MED ORDER — HYDROXYZINE HCL 10 MG PO TABS: 10.0000 mg | ORAL_TABLET | Freq: Every evening | ORAL | 0 refills | Status: DC | PRN

## 2022-01-21 NOTE — Progress Notes (Signed)
[address] [place], [place] [address] Phone: [phone] Fax: [phone]   Office Visit Note  Patient Name: Christy Sanders  Date of OFBPZ:[id number]  Med Rec number [id number]  Date of Service: 01/21/2022  Patient has no known allergies.  Chief Complaint  Patient presents with  . 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  Patient states she had bad anxiety yesterday. States her vision became dark and came close to passing out. Has not had that type of symptom before. Missed class last week when not feeling well (runny nose, fatigue, sore throat), illness seemed to have resolved. Received an email back stating she had "already missed too much class this semester". Has missed other classes for same professor this fall due to migraines. Gave a presentation in another class yesterday AM, was fine. Began to feel lightheaded/shaky in next class. Ate lunch. Did not feel well enough to go to next class but did not want to miss more, so went to class. Began to feel dizzy on way too class, felt anxious, went to bathroom twice, vomited once. States she asked to speak with professor who became angry and "yelled at her". Left class and friend took her home. When climbing stairs, felt her vision became dark and felt lightheaded, arms/legs tingling/little numb. Went to sleep after this. Still felt tired after nap, still a little shaky (trembling). Had one episode of diarrhea last night.  Today, still anxious but feeling otherwise better. Has been able to eat today. Goes to bed around 10, usually able to sleep until 7. Last couple weeks, has been waking up in middle of night, feels she is woken up with something she needs to get done. Lays awake for 1-2 hours before falling asleep again. No issues sleeping in past.  Has never taken medicine for anxiety. States her classes have been demanding since fall break. Denies any hx of psychotherapy/counseling, not  really interested in this, worries about stigma. States her parents are aware of issue and would be supportive of seeing counselor.   Semester has been more stressful than usual. Taking 18 credits, doing okay in her classes.    Current Medication:  Outpatient Encounter Medications as of 01/21/2022  Medication Sig  . ibuprofen (ADVIL) 400 MG tablet Take 400 mg by mouth every 6 (six) hours as needed.  Marland Kitchen KYLEENA 19.5 MG IUD 19.5 mg by Intrauterine route once.  . SUMAtriptan (IMITREX) 25 MG tablet Take 25 mg by mouth every 2 (two) hours as needed for migraine. May repeat in 2 hours if headache persists or recurs.  . [DISCONTINUED] Drospirenone (SLYND) 4 MG TABS Take 1 tablet by mouth daily. (Patient not taking: Reported on 01/21/2022)   No facility-administered encounter medications on file as of 01/21/2022.      Medical History: Past Medical History:  Diagnosis Date  . Migraines   . Ovarian cyst   . Scoliosis      Vital Signs: BP 118/83   Pulse 86   Temp 99 F (37.2 C) (Tympanic)   Ht 5' 7.32" (1.71 m)   Wt 124 lb (56.2 kg)   SpO2 99%   BMI 19.24 kg/m    Review of Systems  Physical Exam    Assessment/Plan:   General Counseling: tolulope pinkett understanding of the findings of todays visit and agrees with plan of treatment. I have discussed any further diagnostic evaluation that may be needed or ordered today. We also reviewed her  medications today. she has been encouraged to call the office with any questions or concerns that should arise related to todays visit.   No orders of the defined types were placed in this encounter.   No orders of the defined types were placed in this encounter.   Time spent:*** Minutes    Jonathon Resides PA-C [facility] 01/21/2022 1:51 PM

## 2022-01-24 ENCOUNTER — Encounter: Payer: Self-pay | Admitting: Medical

## 2022-01-25 NOTE — Patient Instructions (Addendum)
-  Schedule an appointment with [facility] or utilize [facility] behavioral health/[facility] services to discuss your anxiety and strategies to help manage it. -Talk to your parents and friends when you need support or are feeling overwhelmed. -Reach out to your advisor or another trusted professor for help managing situation with your professor.  -Consider trying Hydroxyzine at bedtime or if you wake up feeling anxious/overwhelmed. Trying writing down the things you are worried about when you wake up in the middle of the night rather than going over them in your head. -Try to get some regular physical activity each day, even just going for a walk with a friend.  -Consider scheduling an appointment with your PCP/pediatrician over the upcoming break to discuss your anxiety.  -Send MyChart message to provider or schedule return visit as needed.

## 2022-02-12 ENCOUNTER — Telehealth: Payer: Self-pay | Admitting: Medical

## 2022-02-12 DIAGNOSIS — F419 Anxiety disorder, unspecified: Secondary | ICD-10-CM

## 2022-02-12 NOTE — Telephone Encounter (Signed)
Fax incoming from pharmacy for refill on hydroxyzine 10mg .  Please advise on refill.  Patient is requesting a 90 day supply if taken one tablet PRN at night.    Last OV was on 01/21/2022 no follow up appointments scheduled.

## 2022-02-13 MED ORDER — HYDROXYZINE HCL 10 MG PO TABS: 10.0000 mg | ORAL_TABLET | Freq: Every evening | ORAL | 0 refills | Status: AC | PRN

## 2022-02-13 NOTE — Telephone Encounter (Signed)
Reviewed previous note. Do not feel 90-day supply is appropriate for this patient without additional follow up. Will send refill for #30. If patient needs additional refill, will need to schedule follow up or see PCP for additional discussion of medication for anxiety/sleep.
